# Patient Record
Sex: Female | Born: 1965 | Race: White | Hispanic: No
Health system: Southern US, Community
[De-identification: ages and names within clinical notes are randomized; demographics above are authoritative.]

---

## 2009-12-05 ENCOUNTER — Emergency Department: Payer: Self-pay | Admitting: Emergency Medicine

## 2010-01-20 ENCOUNTER — Ambulatory Visit: Payer: Self-pay | Admitting: Internal Medicine

## 2010-01-25 ENCOUNTER — Emergency Department: Payer: Self-pay

## 2011-04-22 ENCOUNTER — Emergency Department: Payer: Self-pay

## 2011-04-23 ENCOUNTER — Ambulatory Visit: Payer: Self-pay | Admitting: Internal Medicine

## 2011-04-28 ENCOUNTER — Emergency Department: Payer: Self-pay | Admitting: Emergency Medicine

## 2011-05-02 ENCOUNTER — Ambulatory Visit: Payer: Self-pay | Admitting: Internal Medicine

## 2011-05-02 ENCOUNTER — Emergency Department: Payer: Self-pay | Admitting: *Deleted

## 2011-05-07 ENCOUNTER — Observation Stay: Payer: Self-pay | Admitting: Specialist

## 2011-05-07 ENCOUNTER — Inpatient Hospital Stay: Payer: Self-pay | Admitting: Psychiatry

## 2011-05-24 ENCOUNTER — Ambulatory Visit: Payer: Self-pay | Admitting: Internal Medicine

## 2011-05-24 ENCOUNTER — Emergency Department: Payer: Self-pay | Admitting: Emergency Medicine

## 2011-05-25 ENCOUNTER — Emergency Department: Payer: Self-pay | Admitting: Emergency Medicine

## 2011-05-26 ENCOUNTER — Ambulatory Visit: Payer: Self-pay | Admitting: Internal Medicine

## 2011-06-23 ENCOUNTER — Ambulatory Visit: Payer: Self-pay | Admitting: Internal Medicine

## 2011-07-24 ENCOUNTER — Ambulatory Visit: Payer: Self-pay | Admitting: Internal Medicine

## 2011-08-23 ENCOUNTER — Ambulatory Visit: Payer: Self-pay | Admitting: Internal Medicine

## 2011-09-04 ENCOUNTER — Ambulatory Visit: Payer: Self-pay

## 2011-09-08 ENCOUNTER — Inpatient Hospital Stay: Payer: Self-pay

## 2011-09-10 LAB — PATHOLOGY REPORT

## 2011-09-23 ENCOUNTER — Ambulatory Visit: Payer: Self-pay | Admitting: Internal Medicine

## 2011-10-24 ENCOUNTER — Ambulatory Visit: Payer: Self-pay | Admitting: Internal Medicine

## 2012-07-13 ENCOUNTER — Ambulatory Visit: Payer: Self-pay | Admitting: Oncology

## 2012-07-13 LAB — DRUG SCREEN, URINE
Cannabinoid 50 Ng, Ur ~~LOC~~: NEGATIVE (ref ?–50)
Cocaine Metabolite,Ur ~~LOC~~: NEGATIVE (ref ?–300)
Methadone, Ur Screen: NEGATIVE (ref ?–300)
Opiate, Ur Screen: POSITIVE (ref ?–300)
Phencyclidine (PCP) Ur S: NEGATIVE (ref ?–25)

## 2012-07-16 ENCOUNTER — Emergency Department: Payer: Self-pay | Admitting: Internal Medicine

## 2012-07-16 LAB — COMPREHENSIVE METABOLIC PANEL
Alkaline Phosphatase: 111 U/L (ref 50–136)
Anion Gap: 8 (ref 7–16)
BUN: 19 mg/dL — ABNORMAL HIGH (ref 7–18)
Chloride: 110 mmol/L — ABNORMAL HIGH (ref 98–107)
Co2: 28 mmol/L (ref 21–32)
Creatinine: 0.75 mg/dL (ref 0.60–1.30)
EGFR (African American): >60
EGFR (Non-African Amer.): >60
Osmolality: 292 (ref 275–301)
SGOT(AST): 18 U/L (ref 15–37)
SGPT (ALT): 23 U/L (ref 12–78)

## 2012-07-16 LAB — CBC
HCT: 37.6 % (ref 35.0–47.0)
HGB: 12.6 g/dL (ref 12.0–16.0)
MCH: 33.3 pg (ref 26.0–34.0)
MCV: 100 fL (ref 80–100)
RBC: 3.77 10*6/uL — ABNORMAL LOW (ref 3.80–5.20)

## 2012-07-16 LAB — CK TOTAL AND CKMB (NOT AT ARMC)
CK, Total: 28 U/L (ref 21–215)
CK-MB: <0.5 ng/mL — ABNORMAL LOW (ref 0.5–3.6)

## 2012-07-18 ENCOUNTER — Emergency Department: Payer: Self-pay | Admitting: Emergency Medicine

## 2012-07-22 LAB — CULTURE, BLOOD (SINGLE)

## 2012-07-23 ENCOUNTER — Ambulatory Visit: Payer: Self-pay | Admitting: Oncology

## 2012-07-24 ENCOUNTER — Inpatient Hospital Stay: Payer: Self-pay | Admitting: Psychiatry

## 2012-07-24 LAB — COMPREHENSIVE METABOLIC PANEL WITH GFR
Albumin: 2.7 g/dL — ABNORMAL LOW
Alkaline Phosphatase: 109 U/L
Anion Gap: 8
BUN: 16 mg/dL
Bilirubin,Total: 0.1 mg/dL — ABNORMAL LOW
Calcium, Total: 8.1 mg/dL — ABNORMAL LOW
Chloride: 111 mmol/L — ABNORMAL HIGH
Co2: 29 mmol/L
Creatinine: 0.64 mg/dL
EGFR (African American): >60
EGFR (Non-African Amer.): >60
Glucose: 90 mg/dL
Osmolality: 295
Potassium: 3.5 mmol/L
SGOT(AST): 27 U/L
SGPT (ALT): 30 U/L
Sodium: 148 mmol/L — ABNORMAL HIGH
Total Protein: 5.9 g/dL — ABNORMAL LOW

## 2012-07-24 LAB — URINALYSIS, COMPLETE
Bilirubin,UR: NEGATIVE
Ketone: NEGATIVE
Nitrite: NEGATIVE
Protein: NEGATIVE
RBC,UR: NONE SEEN /HPF (ref 0–5)
WBC UR: NONE SEEN /HPF (ref 0–5)

## 2012-07-24 LAB — DRUG SCREEN, URINE
Barbiturates, Ur Screen: NEGATIVE (ref ?–200)
Cannabinoid 50 Ng, Ur ~~LOC~~: POSITIVE (ref ?–50)
Cocaine Metabolite,Ur ~~LOC~~: POSITIVE (ref ?–300)
Opiate, Ur Screen: POSITIVE (ref ?–300)

## 2012-07-24 LAB — CBC
HCT: 32.3 % — ABNORMAL LOW
HGB: 11.1 g/dL — ABNORMAL LOW
MCH: 34.4 pg — ABNORMAL HIGH
MCHC: 34.3 g/dL
MCV: 100 fL
Platelet: 315 x10 3/mm 3
RBC: 3.22 X10 6/mm 3 — ABNORMAL LOW
RDW: 15.9 % — ABNORMAL HIGH
WBC: 6.6 x10 3/mm 3

## 2012-07-24 LAB — ETHANOL
Ethanol %: <0.003 %
Ethanol: <3 mg/dL

## 2012-07-24 LAB — SALICYLATE LEVEL: Salicylates, Serum: <1.7 mg/dL

## 2012-07-26 LAB — CBC WITH DIFFERENTIAL/PLATELET
Basophil #: 0 10*3/uL (ref 0.0–0.1)
Eosinophil #: 0.1 10*3/uL (ref 0.0–0.7)
HGB: 10.9 g/dL — ABNORMAL LOW (ref 12.0–16.0)
Lymphocyte #: 1 10*3/uL (ref 1.0–3.6)
Lymphocyte %: 17.1 %
MCH: 33.8 pg (ref 26.0–34.0)
Monocyte %: 10.4 %
Neutrophil #: 4.1 10*3/uL (ref 1.4–6.5)
Neutrophil %: 69.7 %
RBC: 3.23 10*6/uL — ABNORMAL LOW (ref 3.80–5.20)
WBC: 5.8 10*3/uL (ref 3.6–11.0)

## 2012-07-26 LAB — COMPREHENSIVE METABOLIC PANEL
Albumin: 2.7 g/dL — ABNORMAL LOW (ref 3.4–5.0)
Alkaline Phosphatase: 119 U/L (ref 50–136)
Anion Gap: 6 — ABNORMAL LOW (ref 7–16)
BUN: 16 mg/dL (ref 7–18)
Calcium, Total: 8.8 mg/dL (ref 8.5–10.1)
Chloride: 108 mmol/L — ABNORMAL HIGH (ref 98–107)
Co2: 29 mmol/L (ref 21–32)
Creatinine: 0.78 mg/dL (ref 0.60–1.30)
Glucose: 91 mg/dL (ref 65–99)
SGOT(AST): 22 U/L (ref 15–37)
SGPT (ALT): 25 U/L (ref 12–78)
Total Protein: 5.9 g/dL — ABNORMAL LOW (ref 6.4–8.2)

## 2012-08-04 ENCOUNTER — Ambulatory Visit: Payer: Self-pay | Admitting: Pain Medicine

## 2012-08-11 LAB — DRUG SCREEN, URINE
Amphetamines, Ur Screen: NEGATIVE (ref ?–1000)
Methadone, Ur Screen: NEGATIVE (ref ?–300)
Tricyclic, Ur Screen: NEGATIVE (ref ?–1000)

## 2012-08-22 ENCOUNTER — Ambulatory Visit: Payer: Self-pay | Admitting: Oncology

## 2012-09-08 LAB — DRUG SCREEN, URINE
Barbiturates, Ur Screen: NEGATIVE (ref ?–200)
Benzodiazepine, Ur Scrn: POSITIVE (ref ?–200)
Cannabinoid 50 Ng, Ur ~~LOC~~: NEGATIVE (ref ?–50)
Cocaine Metabolite,Ur ~~LOC~~: NEGATIVE (ref ?–300)
Opiate, Ur Screen: NEGATIVE (ref ?–300)

## 2012-09-22 ENCOUNTER — Ambulatory Visit: Payer: Self-pay | Admitting: Oncology

## 2012-09-24 IMAGING — US ABDOMEN ULTRASOUND
1 series · 17 of 25 positions shown · non-contrast
Comparison: none

REASON FOR EXAM: abnormal LFT's
COMMENTS:

[Series 1: abdomen ultrasound · 17 of 57 slices shown]
[im 1/57]
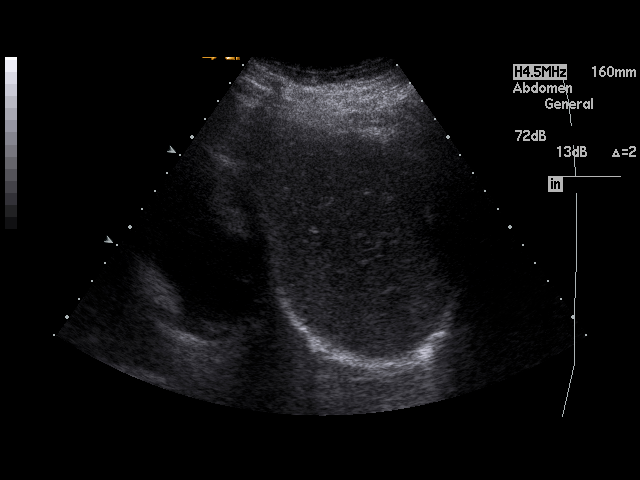
[im 5/57]
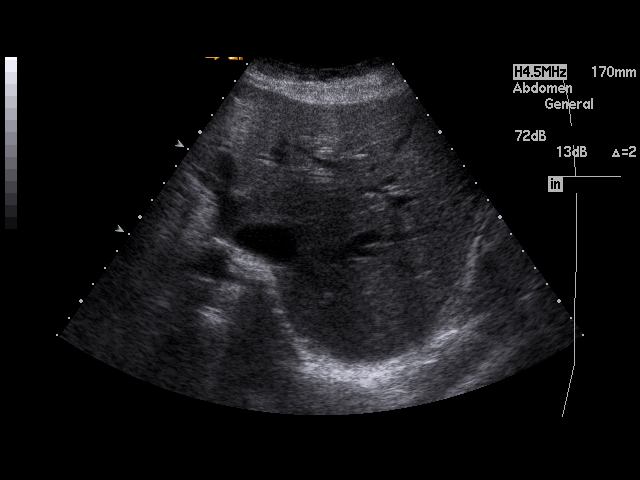
[im 8/57]
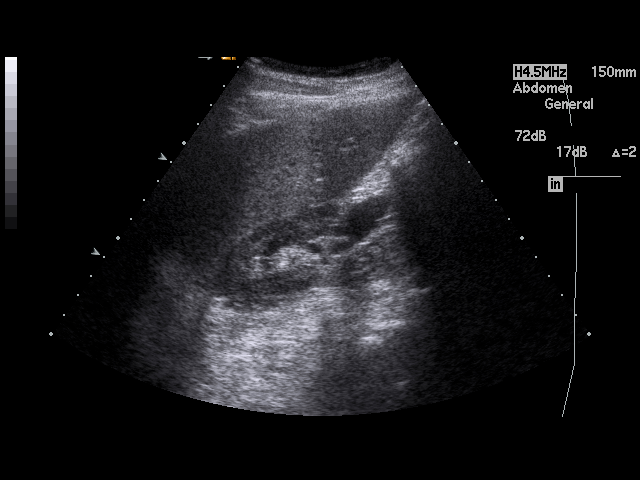
[im 12/57]
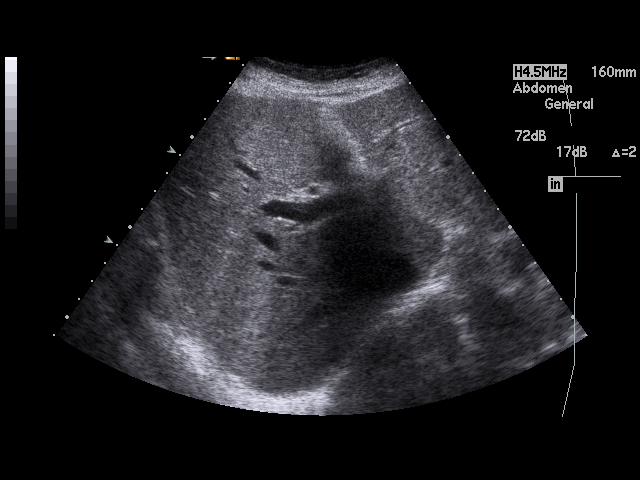
[im 15/57]
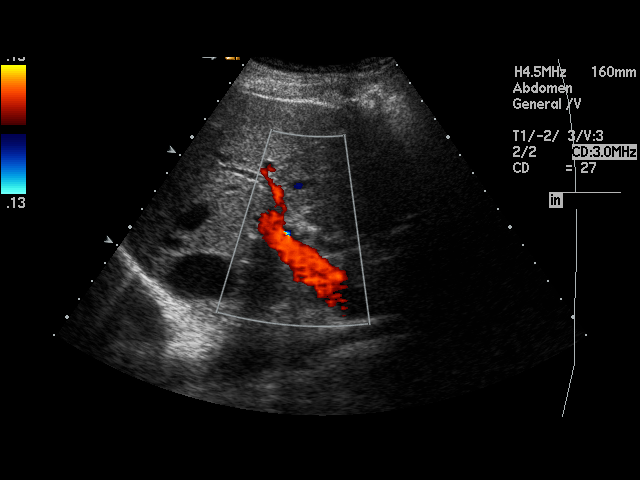
[im 19/57]
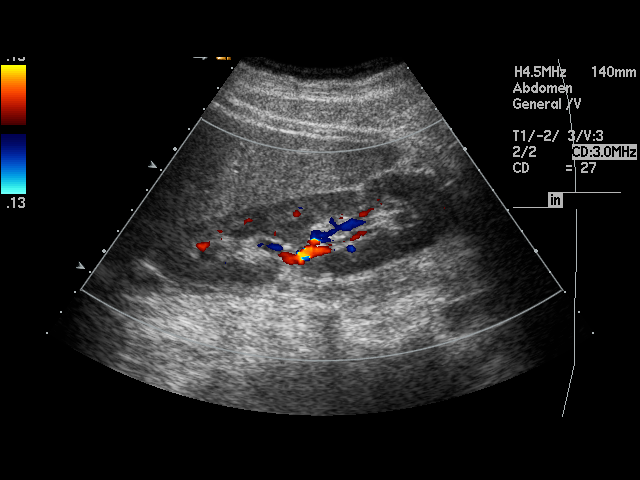
[im 22/57]
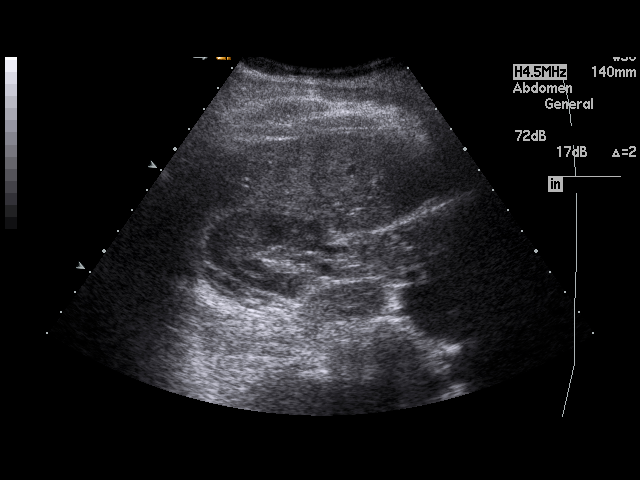
[im 26/57]
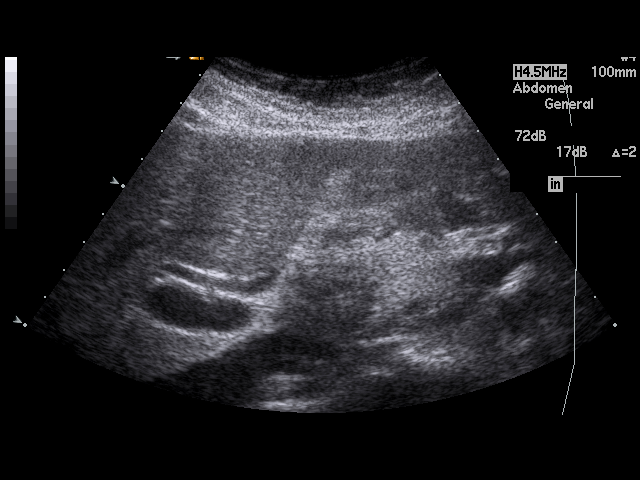
[im 29/57]
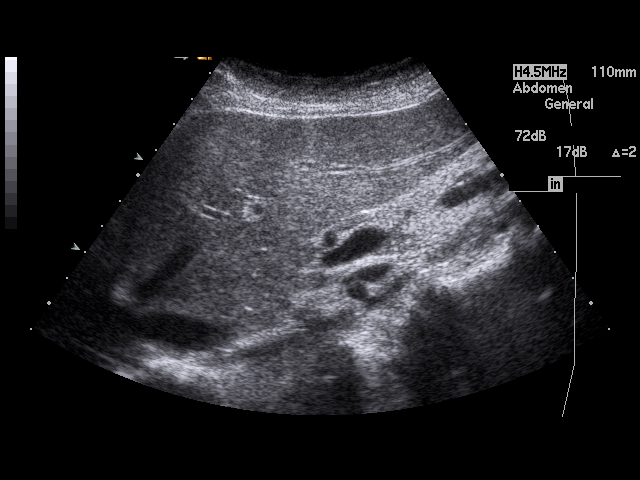
[im 31/57]
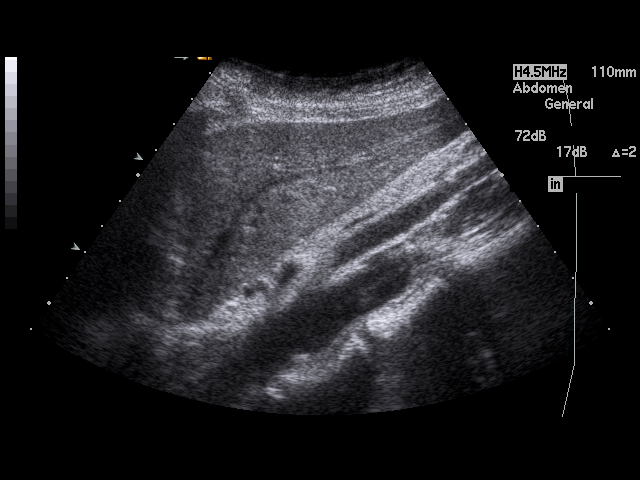
[im 36/57]
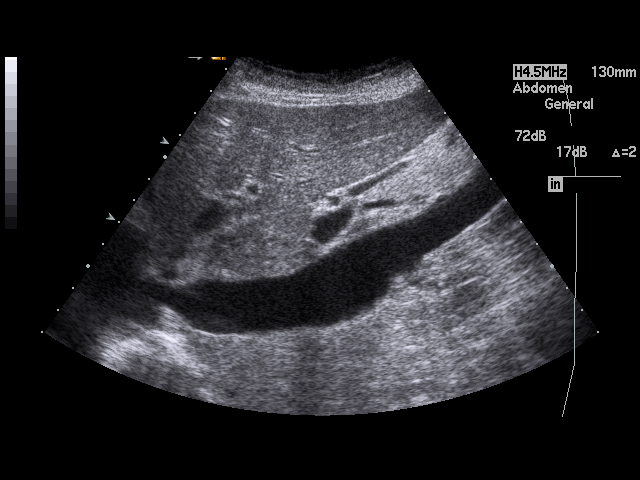
[im 38/57]
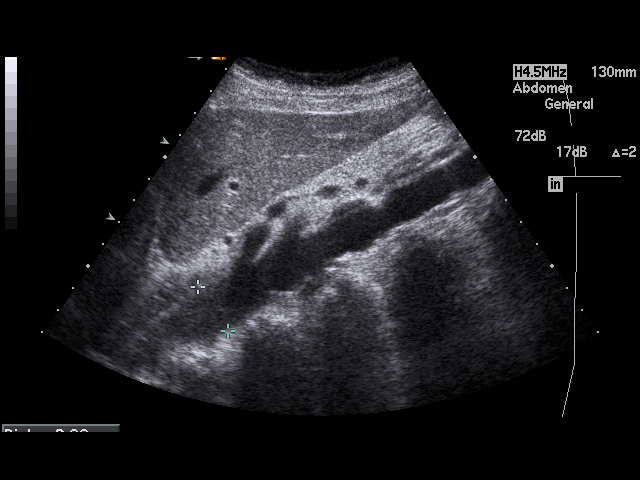
[im 43/57]
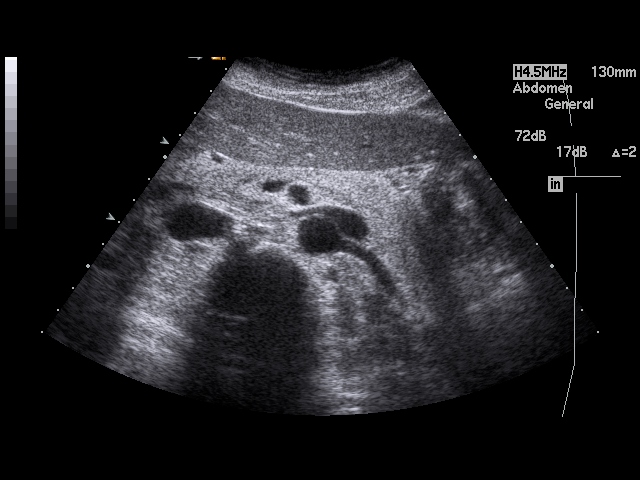
[im 45/57]
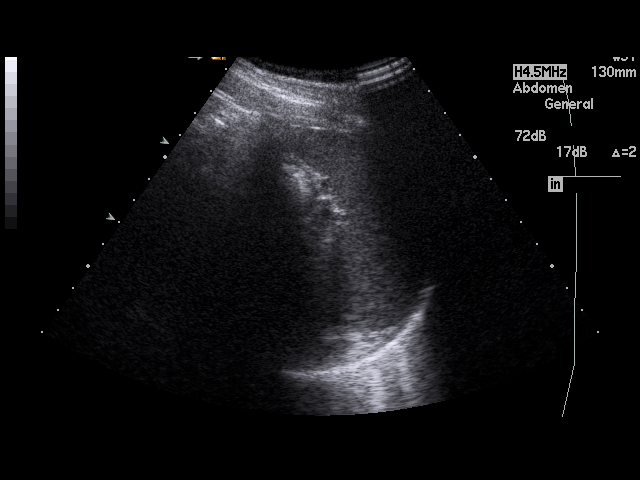
[im 50/57]
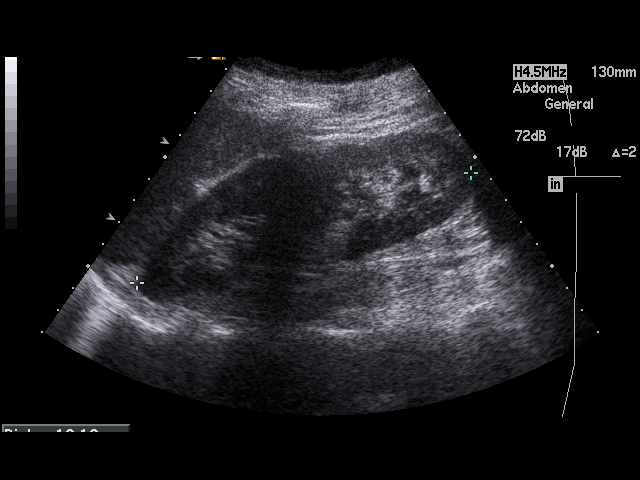
[im 52/57]
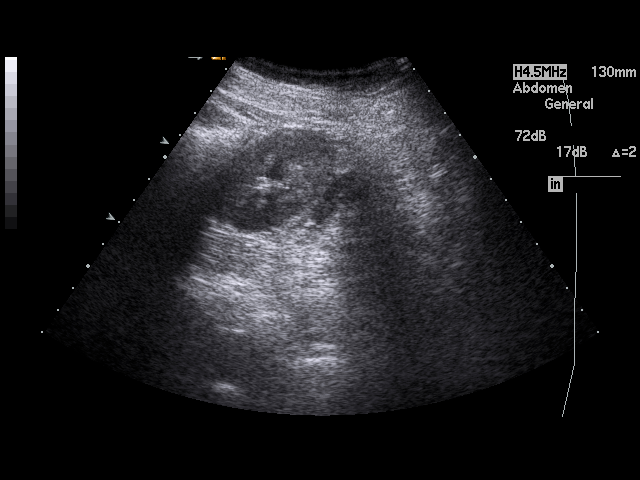
[im 57/57]
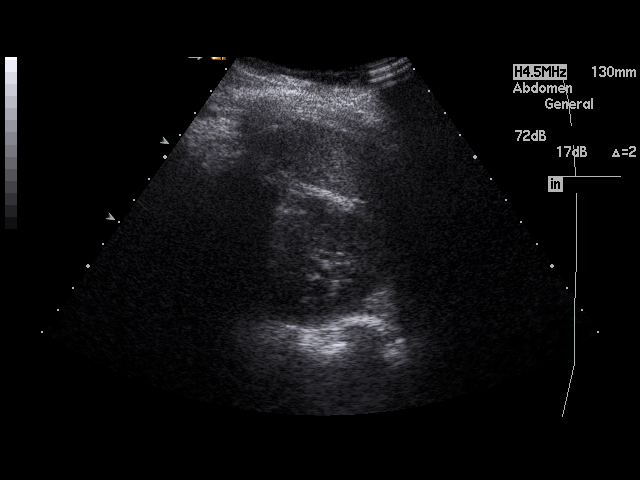

[17 of 25 positions shown; findings below may reference images not displayed]

PROCEDURE:     US  - US ABDOMEN GENERAL SURVEY  - May 06, 2011  [DATE]

RESULT:     Emergent ultrasound of the abdomen is performed. The patient has
a history of cholecystectomy. Visualized portions of the liver, portal
venous flow, right kidney, aorta, inferior vena cava, pancreas, split kidney
and spleen appear to be unremarkable. The common bile duct diameter measures
4.8 to 5.0 mm which is within normal limits. The kidneys show no mass or
stone. There is no obstruction. No ascites or abnormal fluid collection is
evident. Or no aneurysmal dilation is seen within the aorta.
IMPRESSION: 1. Status post cholecystectomy.
2. No acute abnormality evident.

## 2013-02-20 DEATH — deceased

## 2013-12-05 IMAGING — CT CT CHEST W/ CM
1 series · 16 of 32 positions shown, 20 images · non-contrast
Comparison: none

REASON FOR EXAM: chest pain sob
COMMENTS:

PROCEDURE:     CT  - CT CHEST (FOR PE) W  - July 16, 2012 [DATE]
RESULT:     History: Chest pain.
Comparison Study: Prior CT of 08/22/2011.

[Series 4: soft tissue · axial · 0.63mm/px · z∈[+288,+590]mm · 16 of 111 slices shown, 20 images]
[im 5/111  mediastinal]
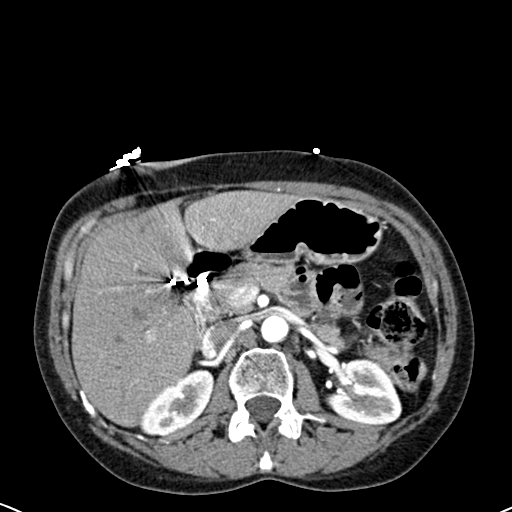
[im 5/111  lung]
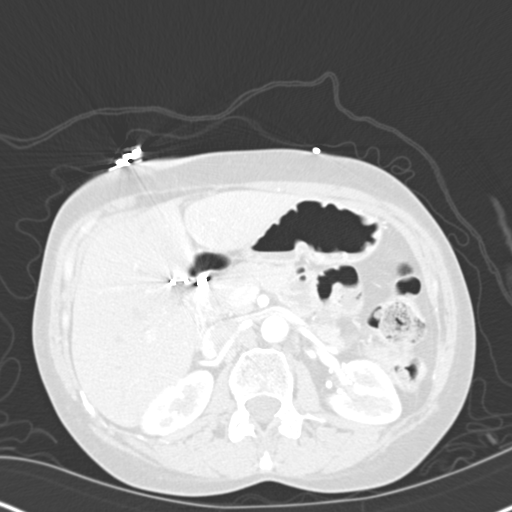
[im 13/111  lung]
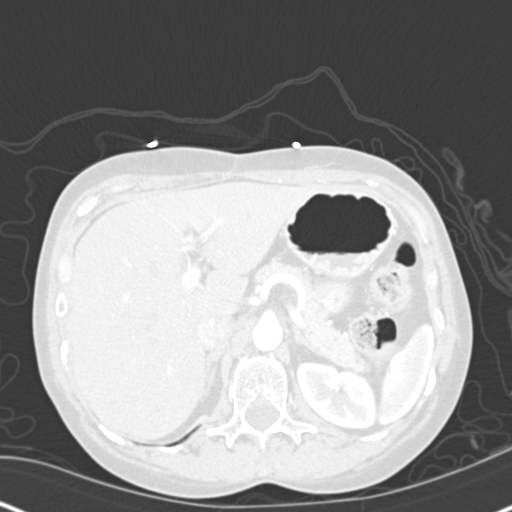
[im 21/111  lung]
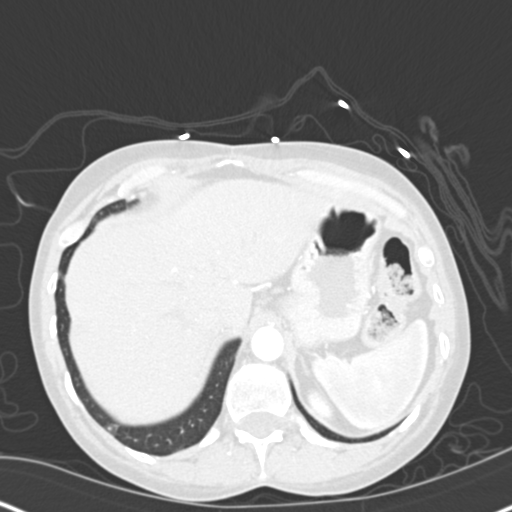
[im 25/111  lung]
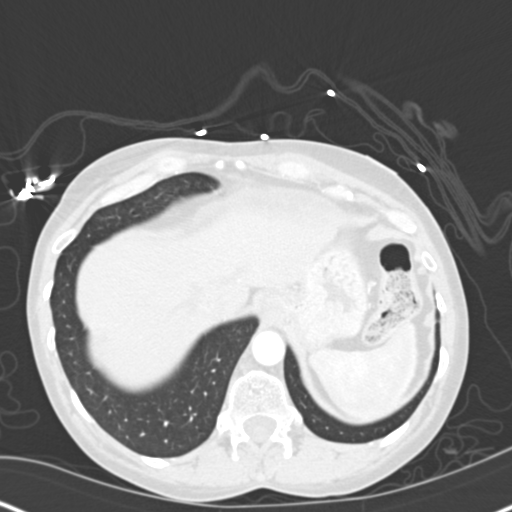
[im 33/111  mediastinal]
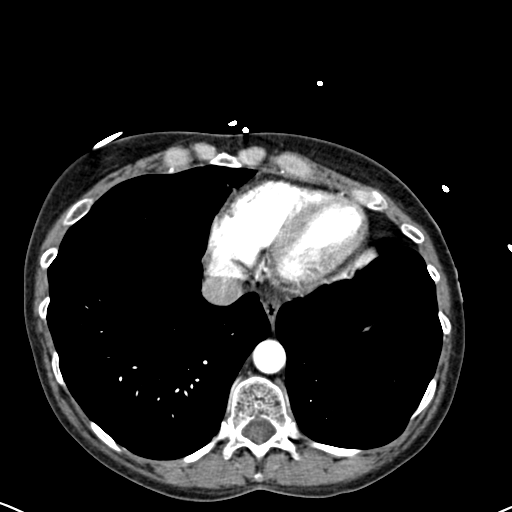
[im 33/111  lung]
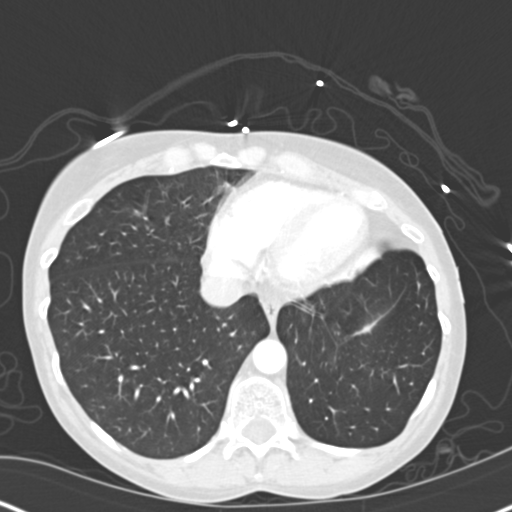
[im 41/111  lung]
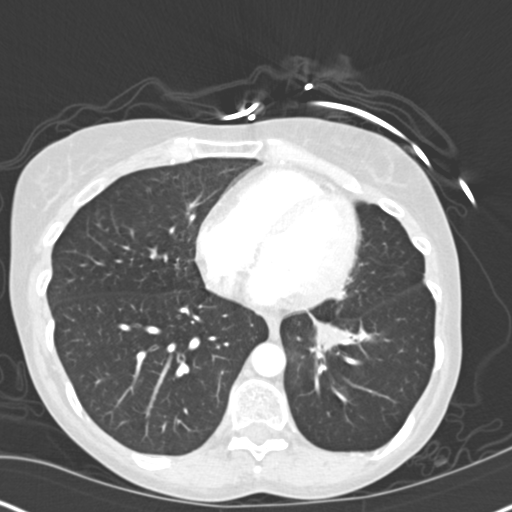
[im 49/111  lung]
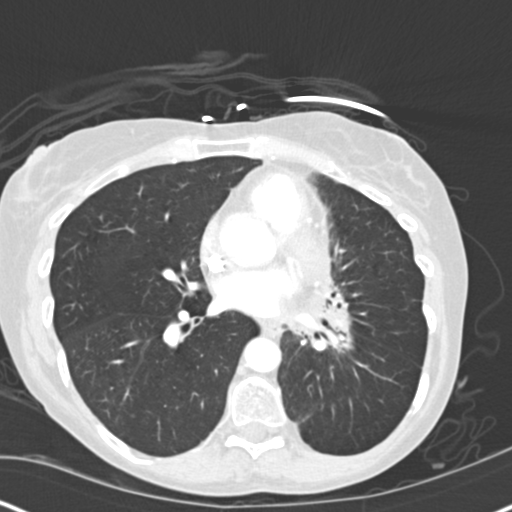
[im 58/111  lung]
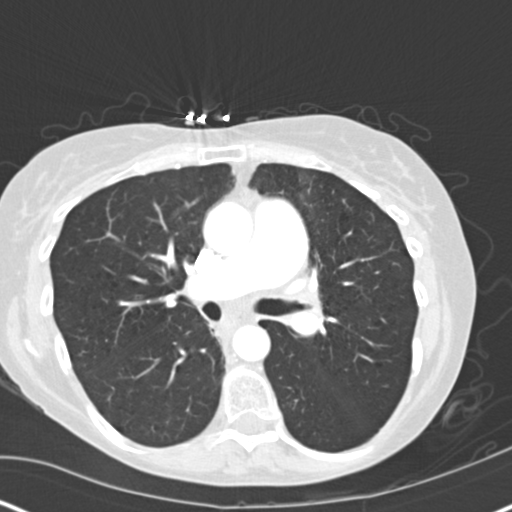
[im 59/111  mediastinal]
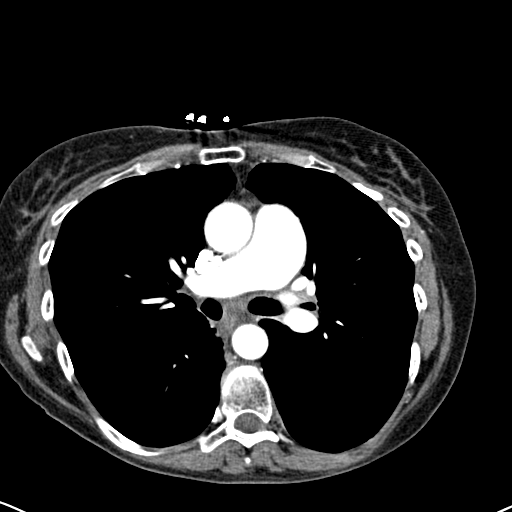
[im 59/111  lung]
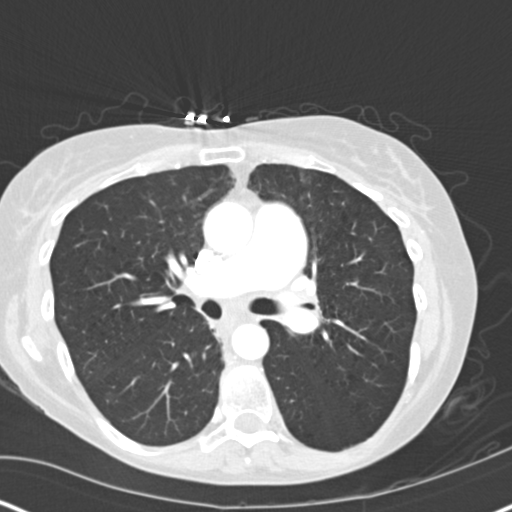
[im 66/111  lung]
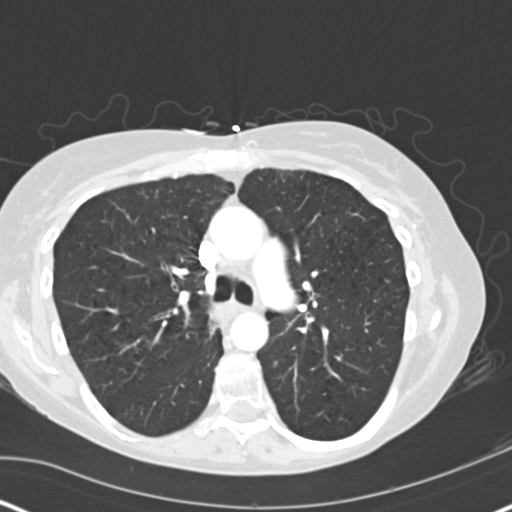
[im 70/111  lung]
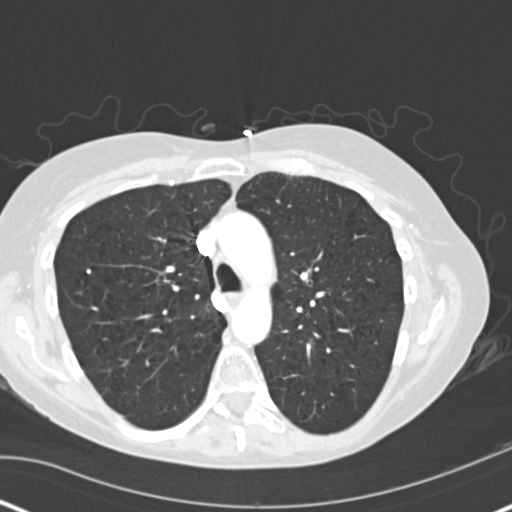
[im 78/111  lung]
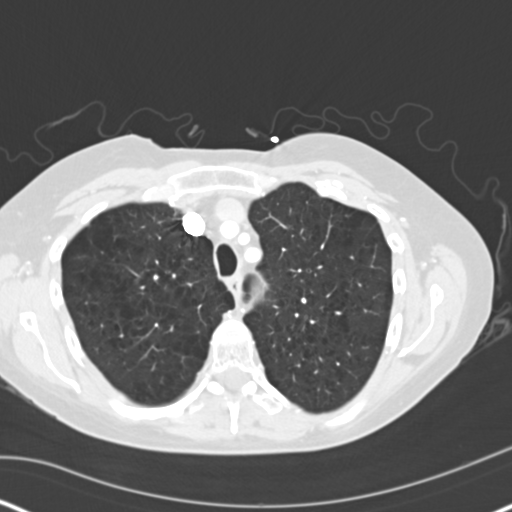
[im 86/111  mediastinal]
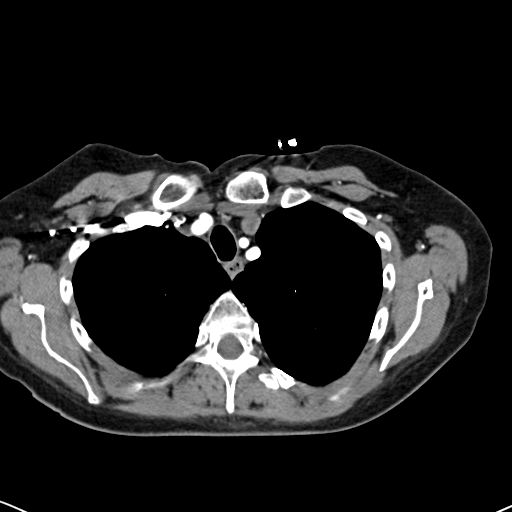
[im 86/111  lung]
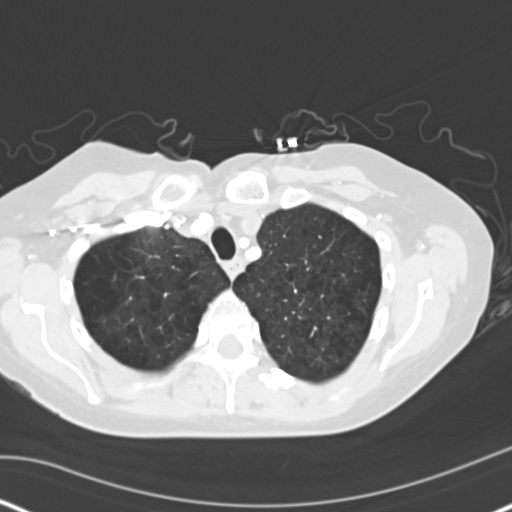
[im 90/111  lung]
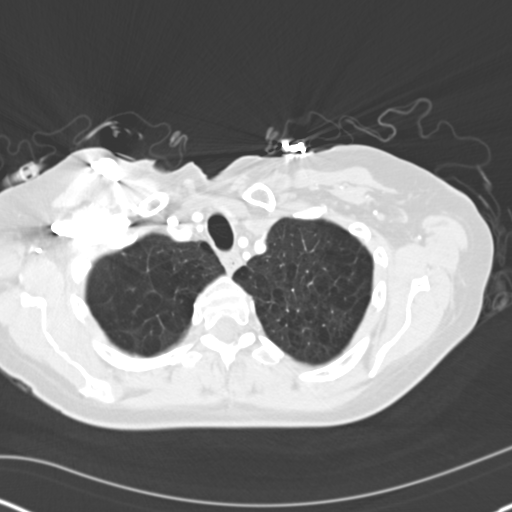
[im 98/111  lung]
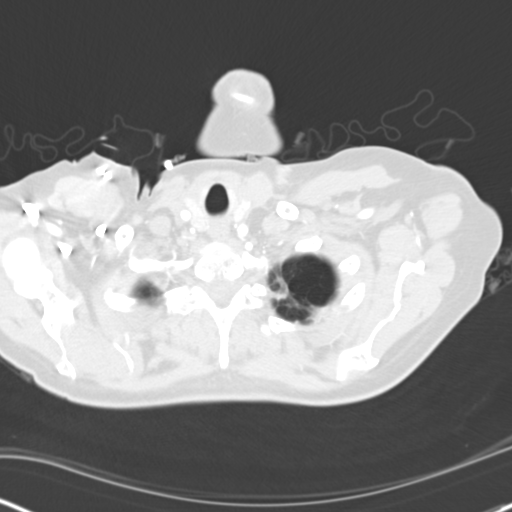
[im 106/111  lung]
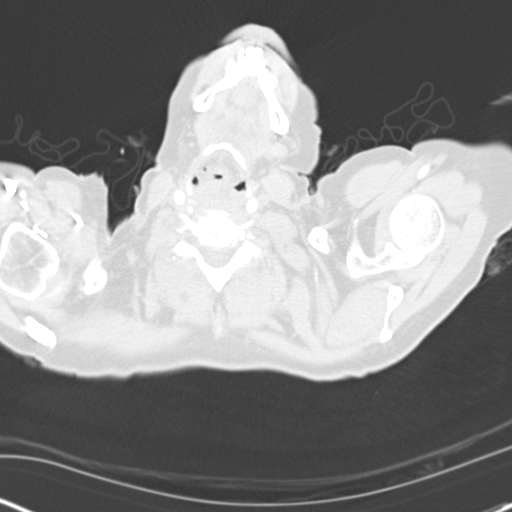

[16 of 32 positions shown; findings below may reference images not displayed]

FINDINGS: Standard CT obtained the 100 cc of Zsovue-OQM. Thoracic aorta is
normal. Adrenals are normal. No pulmonary embolus. There is a stable
calcified lesion in the right upper lobe consistent with granuloma. An
ill-defined infiltrate versus mass in the left hilar and infrahilar region
is noted. This could be malignant or infectious. COPD. Prior cholecystectomy.
IMPRESSION: Left infrahilar infiltrate versus mass lesion, followup
chest CT to demonstrate resolution suggested.

## 2013-12-13 IMAGING — CR DG CHEST 1V PORT
1 series · 1 of 1 positions shown · non-contrast
Comparison: none

REASON FOR EXAM: Lung CA, altered mental status
COMMENTS:

[ap]
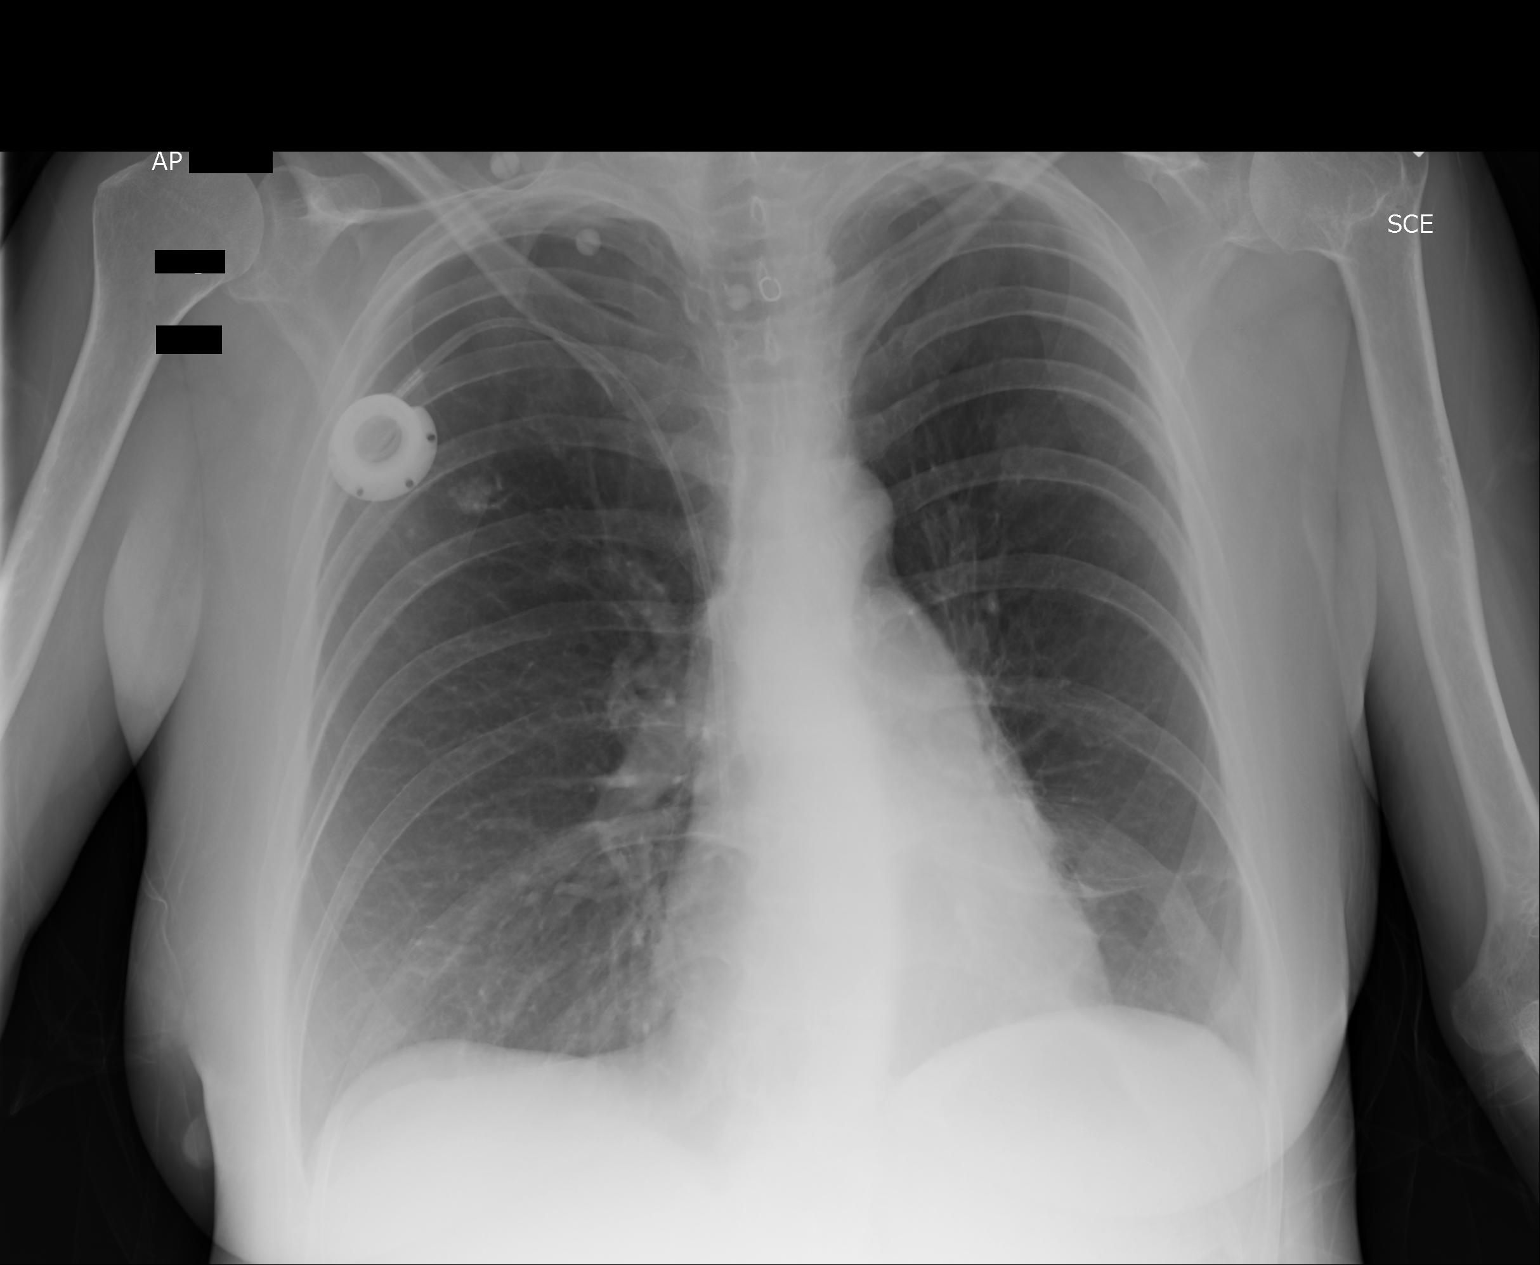

[1 of 1 positions shown; findings below may reference images not displayed]

PROCEDURE:     DXR - DXR PORTABLE CHEST SINGLE VIEW  - July 24, 2012  [DATE]

RESULT:     Comparison is made to the study 16 July, 2012.

The lungs are hyperinflated consistent with COPD. There is a Port-A-Cath
device present on the right. The tip of the catheter appears to be in the
superior vena cava. There is no evidence of edema, infiltrate, effusion or
pneumothorax.
IMPRESSION: Hyperinflation consistent with COPD. No acute bony
abnormality evident. The left hilar and infrahilar infiltrate versus
malignant lesion seen on the previous CT is not evident on this single view
today. Followup PA and lateral views or CT of the chest may be beneficial.

[REDACTED]
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
## 2015-01-09 NOTE — Consult Note (Signed)
History of Present Illness:   Reason for Consult Carcinoma of lung, adenocarcinoma) status post chemoradiation therapy    HPI   49 year old lady with locally advanced non-small cell carcinoma of lung treated with radiation and chemoth.erapy.  Patient has been going around in a different states.  Was not a hospice care in [place].  Has decided to come back to  [place].  Patient is here to reestablish her oncology care.  She continues to complain of pain in  left upper chest on area.  Taking number of OxyContin,  and oxycodone.  Also taking Xanax and Clonopin and steroid.  Patient had number of emergency room visit.  When emotional.  Patient  does not have any home to go to.  She lives in homeless shelter .  Had a CT scan on July 16, 2012 and it has been reviewed. was admitted in the hospital in psychiatric wardbecause of complaining of suicideal ideation.  Drug dependency.to complain of pain in the left upper chest   areaa and pain all over the body.  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    Performance Status (ECOG) 0    Lungs cough  SOB  chest wall pain  cough and sob improved.    GI nausea  appetie improved.    Musculoskeletal Left-sided chest pain better    Extremities no complaints    Skin no complaints    Neuro no complaints    Review of Systems   chest wall pain Swelling of both lower extremity  Physical Exam:    General Patient is alert and oriented not in any acute distress    HEENT: normal    Lungs: clear  rales  crepitations    Cardiac: regular rate, rhythm    Abdomen: nontender  positive bowel sounds    Skin: intact    Neuro: Difficult exam in    Psych: mood agitated  depressed    Physical Exam 1+ swelling of lower extremity     depression:    anxiety:    paralysized vocal cord:    lt breast lumpectomy:    hx chemo:    lung CA:    port caqth to right subclavian:    c-section x2:    Cholecystectomy:    Penicillin: Angioedema  Fish: Angioedema    Percocet 10/650: 1 tab(s) po every 6 hours, Active, 20, None   Xanax 31m PO QID PRN anxiety: 1 tab(s) orally 4 times a day, As Needed- for Anxiety, Nervousness , Active, 0, None  Laboratory Results: Hepatic:  04-Nov-13 05:40    Bilirubin, Total  0.1   Alkaline Phosphatase 119   SGPT (ALT) 25   SGOT (AST) 22   Total Protein, Serum  5.9   Albumin, Serum  2.7  Lab:  04-Nov-13 04:30    O2 Saturation (Pulse Ox) 93   FiO2 (Pulse Ox) ra (Result(s) reported on 26 Jul 2012 at 04:47AM.)  Routine Chem:  04-Nov-13 05:40    Glucose, Serum 91   BUN 16   Creatinine (comp) 0.78   Sodium, Serum 143   Potassium, Serum 4.6   Chloride, Serum  108   CO2, Serum 29   Calcium (Total), Serum 8.8   Osmolality (calc) 286   eGFR (African American) >60   eGFR (Non-African American) >60 (eGFR values <60m/min/1.73 m2 may be an indication of chronic kidney disease (CKD). Calculated eGFR is useful in patients with stable renal function. The eGFR calculation will not be reliable in acutely ill patients when serum creatinine is changing rapidly. It is not useful in  patients on dialysis. The eGFR calculation may not be applicable to patients at the low and high extremes of body sizes, pregnant women, and vegetarians.)   Anion Gap  6  Routine Hem:  04-Nov-13 05:40    WBC (CBC) 5.8   RBC (CBC)  3.23   Hemoglobin (CBC)  10.9    Hematocrit (CBC)  33.0   Platelet Count (CBC) 237   MCV  102   MCH 33.8   MCHC 33.1   RDW  15.8   Neutrophil % 69.7   Lymphocyte % 17.1   Monocyte % 10.4   Eosinophil % 2.0   Basophil % 0.8   Neutrophil # 4.1   Lymphocyte # 1.0   Monocyte # 0.6   Eosinophil # 0.1   Basophil # 0.0 (Result(s) reported on 26 Jul 2012 at 06:45AM.)   Assessment and Plan:  Impression:   49year old female with history of stage IIIa T2 A. N2 M0 non-small cell adenocarcinoma [place] post [place] and radiation [place]Alimta x2 cycles completed in December of 2011. She had  CT chest in 04/22/2011 and echocardiogram which showed findings of Left infrahilar mass lesion is noted measuring 2 cm.Mediastinal adenopathy was noted. This was primarily infracarinal. Moderate pericardial effusion. Likely malignant. She received 3 cycles of chemotherapy in the palliative setting and with Carboplatin/alimta and was sent for repeat CT chest which showed  Stable appearing right upper lobe pulmonary irregular nodule.  patient has a very difficult social problem.  Has drug  dependency on oxycodone I had prolonged discussion with psychiatry is and hospitalist.  agreed with the plan outlined by psychiatrist Patient's pain medication will be managed by Dr. PVallery Ridgeand palliative care team Patient would be recommended to go to alcohol and drug rehab program Oncological it patient will be reevaluated with PET scan after patient comes back from drug and alcohol rehab program expressed a strong desire not for any further chemotherapy in the event of recurrent disease.   Plan:   situation with palliative care team  CC Referral:   cc: Dr. PVallery Ridge  Electronic Signatures: CJobe Gibbon(MD)  (Signed [phone])  Authored: HISTORY OF PRESENT ILLNESS, PFSH, ROS, PE, PAST MEDICAL  HISTORY, ALLERGIES, HOME MEDICATIONS, LABS, ASSESSMENT AND PLAN, CC Referring Physician   Last Updated: 05-Nov-13 17:15 by CJobe Gibbon(MD)

## 2015-01-09 NOTE — Consult Note (Signed)
PATIENT NAME:  Terri Garcia, Terri Garcia MR#:  [id number] DATE OF BIRTH:  1966/06/13  DATE OF CONSULTATION:  07/25/2012  REFERRING PHYSICIAN:  Margarita Rana, MD CONSULTING PHYSICIAN:  Duane Lope. Judithann Sheen, MD  PRIMARY CARE PHYSICIAN: Johney Maine, MD  REASON FOR ADMISSION: Pain management in a patient with a history of stage IV lung cancer.   HISTORY OF PRESENT ILLNESS: The patient is a 49 year old female with a significant history of stage IIIa lung cancer, followed by Dr. Doylene Canning, who was admitted last night with suicidal thoughts and depression. The patient has a long-standing history of lung cancer and has been treated by Dr. Doylene Canning in the past. She is homeless and lives in homeless shelters. She has had hospice care previously. She was living out of state but recently moved back here. She presented to the Emergency Room last night with suicidal thoughts and ideations. She was admitted to the Behavioral Medicine unit. She has a severe pain for which she is currently getting Percocet 10/325 mg two tablets every six hours. She continues to have pain. She is tearful and depressed wanting to die at this time. Consultation was subsequently requested.   PAST MEDICAL HISTORY:  1. 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  2. Depression.  3. Anxiety.  4. History of polysubstance abuse.  5. Chronic obstructive pulmonary disease/tobacco abuse.  6. Status post cholecystectomy.  7. Previous Cesarean sections x2.   MEDICATIONS:  1. Percocet 10/325 mg one p.o. every 6 hours.  2. Xanax 0.25 mg p.o. twice a day.   ALLERGIES: Penicillin.   SOCIAL HISTORY: The patient denies alcohol or IV drug use. She does smoke one pack per day.   FAMILY HISTORY: Positive for lung cancer.  REVIEW OF SYSTEMS: CONSTITUTIONAL: No fever or change in weight. EYES: No blurred or double vision. No glaucoma. ENT: No tinnitus or hearing loss. No nasal discharge or bleeding. No difficulty  swallowing. RESPIRATORY: The patient has had cough but no wheezing. No hemoptysis. No painful respiration. CARDIOVASCULAR: The patient has chronic chest pain from her lung cancer. Denies orthopnea. No palpitations or syncope. GI: No nausea, vomiting, or diarrhea. No change in bowel habits. GU: No dysuria or hematuria. No incontinence. ENDOCRINE: No polyuria or polydipsia. No heat or cold intolerance. HEMATOLOGIC: The patient denies anemia, easy bruising, or bleeding. LYMPHATIC: No swollen glands. MUSCULOSKELETAL: The patient denies pain in her back but denies pain in her neck, knees, or hips. NEUROLOGIC: No numbness or weakness. Denies strokes, seizures, or migraines. PSYCH: The patient does admit to anxiety and depression, but denies insomnia.   PHYSICAL EXAMINATION:   GENERAL: The patient is in no acute distress.   VITAL SIGNS: Vital signs are currently remarkable for a blood pressure of 101/61 with a heart rate of 109 and a respiratory rate of 18. She is afebrile.   HEENT: Normocephalic, atraumatic. Pupils are equal, round, and reactive to light and accommodation. Extraocular movements are intact. Sclerae are anicteric. Conjunctivae are clear. Oropharynx is clear.   NECK: Supple without jugular venous distention. No adenopathy or thyromegaly is noted.  PULMONARY: Lungs reveal scattered rhonchi without wheezes or rales. No dullness.   CARDIAC: Regular rate and rhythm. Normal S1 and S2. No significant rubs, murmurs, or gallops. PMI is nondisplaced. Chest wall is nontender.   ABDOMEN: Soft and nontender with normoactive bowel sounds. No organomegaly or masses were appreciated. No hernias or bruits were noted.   EXTREMITIES: No clubbing, cyanosis, or edema. Pulses were 2+ bilaterally.  SKIN: Warm and dry without rash or lesions.   NEUROLOGIC: Cranial nerves II through XII grossly intact. Deep tendon reflexes were symmetric. Motor and sensory examination is nonfocal.   PSYCHIATRIC: Examination  revealed a patient who was alert and oriented to person, place, and time. She was cooperative and used good judgment.   LABS/RADIOLOGIC STUDIES: Urine drug screen was positive for cannabinoids, benzos, opiates, and cocaine. TSH was 0.63. Glucose was 90 with a BUN of 16 and a creatinine of 0.64 with a sodium of 148 and a potassium of 3.5. GFR was greater than 60. CBC revealed a white count of 6.6 with a hemoglobin of 11.1.   Chest x-ray done revealed no acute changes. Chronic obstructive pulmonary was noted.   ASSESSMENT:  1. Stage IIIa non-small cell lung cancer.  2. Chronic pain.  3. Polysubstance abuse.  4. Anxiety/depression.  5. Anemia of chronic disease.  6. Chronic obstructive pulmonary disease/tobacco abuse.   PLAN: We will increase the patient's Percocet to 2 tablets every four hours as needed and add a Duragesic patch at 100 mcg topically every three days. We will obtain follow-up labs and PA and lateral chest x-ray in the morning. We will consult Dr. Doylene Canninghoksi as well as palliative care and case manager for disposition.               Thank you for the consultation. We will continue to follow this patient with you while in the hospital. Please call if questions arise.   TOTAL TIME SPENT: 45 minutes.  ____________________________ Duane LopeJeffrey D. Judithann SheenSparks, MD jds:slb D: 07/25/2012 14:02:59 ET T: 07/26/2012 09:08:26 ET JOB#: [id number]  cc: Duane LopeJeffrey D. Judithann SheenSparks, MD, <Dictator> Gerome SamJanak K. Doylene Canninghoksi, MD Lynn Sissel Rodena Medin Jacie Tristan MD ELECTRONICALLY SIGNED 07/26/2012 10:21

## 2015-01-09 NOTE — Consult Note (Signed)
Chief Complaint:   Subjective/Chief Complaint Pt with stage IV lung CA followed by Dr. Oliva Bustard with severe chronic pain, admitted with depression and suicidal thoughts.   VITAL SIGNS/ANCILLARY NOTES: **Vital Signs.:   03-Nov-13 08:37   Temperature Temperature (F) 97.7   Pulse Pulse 875   Systolic BP Systolic BP 643   Diastolic BP (mmHg) Diastolic BP (mmHg) 64   Pulse Ox % Pulse Ox % 98   Brief Assessment:   Cardiac Regular    Respiratory rhonchi    Gastrointestinal details normal Soft  Nontender  Bowel sounds normal   Lab Results: Routine Chem:  02-Nov-13 14:25    Glucose, Serum 90   BUN 16   Creatinine (comp) 0.64   Sodium, Serum  148   Potassium, Serum 3.5   Chloride, Serum  111   CO2, Serum 29   eGFR (Non-African American) >60 (eGFR values <58m/min/1.73 m2 may be an indication of chronic kidney disease (CKD). Calculated eGFR is useful in patients with stable renal function. The eGFR calculation will not be reliable in acutely ill patients when serum creatinine is changing rapidly. It is not useful in  patients on dialysis. The eGFR calculation may not be applicable to patients at the low and high extremes of body sizes, pregnant women, and vegetarians.)  Routine Hem:  02-Nov-13 14:25    WBC (CBC) 6.6   Hemoglobin (CBC)  11.1   Platelet Count (CBC) 315 (Result(s) reported on 24 Jul 2012 at 02:59PM.)   Radiology Results: XRay:    02-Nov-13 14:22, Chest Portable Single View   Chest Portable Single View    REASON FOR EXAM:    Lung CA, altered mental status  COMMENTS:       PROCEDURE: DXR - DXR PORTABLE CHEST SINGLE VIEW  - Jul 24 2012  2:22PM     RESULT: Comparison is made to the study of 16 July 2012.    The lungs are hyperinflated consistent with COPD. There is a Port-A-Cath   device present on the right. The tip of the catheter appears to be in the   superior vena cava. There is no evidence of edema, infiltrate, effusion   or  pneumothorax.    IMPRESSION:  Hyperinflation consistent with COPD. No acute bony   abnormality evident. The left hilar and infrahilar infiltrate versus   malignant lesion seen on the previous CT is not evident on this single     view today. Followup PA and lateral views or CT of the chest may be   beneficial.    Dictation Site: 6          Verified By: GSundra Aland M.D., MD   Assessment/Plan:  Assessment/Plan:   Plan Will increase prn percocet to q4h prn, add Duragesic patch. CXR and labs in AM. Consult CM, Palliative Care and Dr. COliva Bustardin AM. Will follow with you.   Electronic Signatures: SIdelle Crouch(MD)  (Signed [id number])  Authored: Chief Complaint, VITAL SIGNS/ANCILLARY NOTES, Brief Assessment, Lab Results, Radiology Results, Assessment/Plan   Last Updated: 03-Nov-13 13:56 by SIdelle Crouch(MD)
